# Patient Record
Sex: Male | Born: 1995 | Race: Black or African American | Hispanic: No | Marital: Single | Smoking: Never smoker
Health system: Southern US, Community
[De-identification: ages and names within clinical notes are randomized; demographics above are authoritative.]

## PROBLEM LIST (undated history)

## (undated) DIAGNOSIS — R109 Unspecified abdominal pain: Secondary | ICD-10-CM

## (undated) DIAGNOSIS — T7840XA Allergy, unspecified, initial encounter: Secondary | ICD-10-CM

## (undated) DIAGNOSIS — J45909 Unspecified asthma, uncomplicated: Secondary | ICD-10-CM

## (undated) DIAGNOSIS — K921 Melena: Secondary | ICD-10-CM

## (undated) HISTORY — PX: OTHER SURGICAL HISTORY: SHX169

## (undated) HISTORY — DX: Melena: K92.1

## (undated) HISTORY — PX: TONSILLECTOMY: SUR1361

## (undated) HISTORY — DX: Unspecified abdominal pain: R10.9

## (undated) HISTORY — PX: ADENOIDECTOMY: SUR15

---
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
## 2015-05-28 ENCOUNTER — Ambulatory Visit (INDEPENDENT_AMBULATORY_CARE_PROVIDER_SITE_OTHER): Payer: 59 | Admitting: Pediatrics

## 2015-05-28 ENCOUNTER — Encounter: Payer: Self-pay | Admitting: Pediatrics

## 2015-05-28 VITALS — BP 126/60 | HR 80 | Temp 98.4°F | Resp 20 | Ht 70.0 in | Wt 233.9 lb

## 2015-05-28 DIAGNOSIS — J4541 Moderate persistent asthma with (acute) exacerbation: Secondary | ICD-10-CM | POA: Diagnosis not present

## 2015-05-28 DIAGNOSIS — J301 Allergic rhinitis due to pollen: Secondary | ICD-10-CM

## 2015-05-28 DIAGNOSIS — J45901 Unspecified asthma with (acute) exacerbation: Secondary | ICD-10-CM | POA: Insufficient documentation

## 2015-05-28 MED ORDER — BECLOMETHASONE DIPROPIONATE 80 MCG/ACT IN AERS: 2.0000 | INHALATION_SPRAY | Freq: Two times a day (BID) | RESPIRATORY_TRACT | Status: AC | PRN

## 2015-05-28 MED ORDER — ALBUTEROL SULFATE HFA 108 (90 BASE) MCG/ACT IN AERS: 2.0000 | INHALATION_SPRAY | Freq: Four times a day (QID) | RESPIRATORY_TRACT | Status: AC | PRN

## 2015-05-28 MED ORDER — ALBUTEROL SULFATE (2.5 MG/3ML) 0.083% IN NEBU: 2.5000 mg | INHALATION_SOLUTION | RESPIRATORY_TRACT | Status: DC | PRN

## 2015-05-28 NOTE — Patient Instructions (Addendum)
Qvar 80- 2 puffs once a day to prevent coughing or wheezing Ventolin 2 puffs every 4 hours if needed for wheezing or coughing spells-Ventolin will replace Pro-air Albuterol 0.083% one unit dose every 4 hours if needed for coughing or wheezing Add prednisone 20 mg twice a day for 3 days, 20 mg on the fourth day, 10 mg on the fifth day Call me if you're not doing well on this treatment plan  Zyrtec 10 mg once a day if needed for runny nose Nasacort 2 sprays per nostril once a day if needed for stuffy nose  Continue avoiding shellfish. If you have an allergic reaction take Benadryl 50 mg every 4 hours and if you have life-threatening symptoms inject  with EpiPen 0.3 mg

## 2015-05-28 NOTE — Progress Notes (Signed)
[address] [place] [address] Dept: [phone]  FOLLOW UP NOTE  Patient ID: Benjamin Moses, male    DOB: 1996/03/28  Age: 20 y.o. MRN: [id number] Date of Office Visit: 05/28/2015  Assessment Chief Complaint: Cough  HPI Benjamin Moses presents for treatment of coughing and wheezing for about 4 days. His nasal congestion has been well controlled. His asthma was well controlled until the past 4 days. In 2005 he was allergic to grass pollens, pollens, tree pollens, and some molds. No airborne testing since then  Current medications Zyrtec 10 mg once a day if needed for runny nose, Nasacort 2 sprays per nostril once a day if needed for stuffy nose, Pro-air 2 puffs every 4 hours if needed, Qvar 80- 2 puffs once a day and albuterol 0.083% one unit dose every 4 hours if needed Drug Allergies:    Allergies  Allergen Reactions  . Food     Soy, citrus, corn   . Red Dye Nausea And Vomiting  . Shellfish Allergy Swelling    Abdominal cramps    Physical Exam: BP 126/60 mmHg  Pulse 80  Temp(Src) 98.4 F (36.9 C) (Oral)  Resp 20  Ht  (1.778 m)  Wt 233 lb 14.5 oz (106.1 kg)  BMI 33.56 kg/m2   Physical Exam  Constitutional: He is oriented to person, place, and time. He appears well-developed and well-nourished.  HENT:  Eyes normal. Ears normal. Nose mild swelling of his turbinates. Pharynx normal.  Neck: Neck supple.  Cardiovascular:  S1 and S2 normal no murmurs  Pulmonary/Chest:  Clear to percussion and auscultation except for some wheezing in both lungs  Lymphadenopathy:    He has no cervical adenopathy.  Neurological: He is alert and oriented to person, place, and time.  Psychiatric: He has a normal mood and affect. His behavior is normal. Thought content normal.  Vitals reviewed.   Diagnostics:  FVC 3.44 L FEV1 2.25 L. Predicted FVC 4.61 L predicted FEV1 3.95 L. After albuterol by nebulization FVC 4.36 L FEV1 3.15 L-his shows a mild reduction in the  forced vital capacity and a moderate reduction in the FEV1 with significant improvement after albuterol  Assessment and Plan: 1. Asthma with acute exacerbation, moderate persistent   2. Allergic rhinitis due to pollen     Meds ordered this encounter  Medications  . beclomethasone (QVAR) 80 MCG/ACT inhaler    Sig: Inhale 2 puffs into the lungs 2 (two) times daily as needed (shortness of breath/wheezing).    Dispense:  1 Inhaler    Refill:  5  . albuterol (PROVENTIL HFA;VENTOLIN HFA) 108 (90 Base) MCG/ACT inhaler    Sig: Inhale 2 puffs into the lungs every 6 (six) hours as needed for wheezing or shortness of breath.    Dispense:  1 Inhaler    Refill:  1  . albuterol (PROVENTIL) (2.5 MG/3ML) 0.083% nebulizer solution    Sig: Take 3 mLs (2.5 mg total) by nebulization every 4 (four) hours as needed for wheezing or shortness of breath.    Dispense:  75 mL    Refill:  1    Patient Instructions  Qvar 80- 2 puffs once a day to prevent coughing or wheezing Ventolin 2 puffs every 4 hours if needed for wheezing or coughing spells-Ventolin will replace Pro-air Albuterol 0.083% one unit dose every 4 hours if needed for coughing or wheezing Add prednisone 20 mg twice a day for 3 days, 20 mg on the fourth day, 10  mg on the fifth day Call me if you're not doing well on this treatment plan  Zyrtec 10 mg once a day if needed for runny nose Nasacort 2 sprays per nostril once a day if needed for stuffy nose  Continue avoiding shellfish. If you have an allergic reaction take Benadryl 50 mg every 4 hours and if you have life-threatening symptoms inject  with EpiPen 0.3 mg    Return in about 6 months (around 11/25/2015).    Thank you for the opportunity to care for this patient.  Please do not hesitate to contact me with questions.  Tonette Bihari, M.D.  [facility] [address] [place], [place] [address] [phone]

## 2015-06-26 ENCOUNTER — Telehealth: Payer: Self-pay | Admitting: Internal Medicine

## 2015-06-26 NOTE — Telephone Encounter (Signed)
She wants to make sure that his charges were filed on his insurance.

## 2015-06-27 NOTE — Telephone Encounter (Signed)
TOLD MOM THAT THIS WAS FILE BUT IT WENT TO THE DED - SHE WILL CALL INS - SHE DOESN'T THINK THEY HAVE A DED.

## 2015-11-25 ENCOUNTER — Ambulatory Visit: Payer: 59 | Admitting: Pediatrics

## 2016-01-27 IMAGING — CR DG ABDOMEN 1V
1 series · 1 of 1 positions shown · non-contrast
Comparison: CT ABD-PELV W/ CM dated 08/08/2013

CLINICAL DATA: Two month history of right upper quadrant abdominal
pain and nausea

EXAM:
ABDOMEN - 1 VIEW

[t abdomen supine]
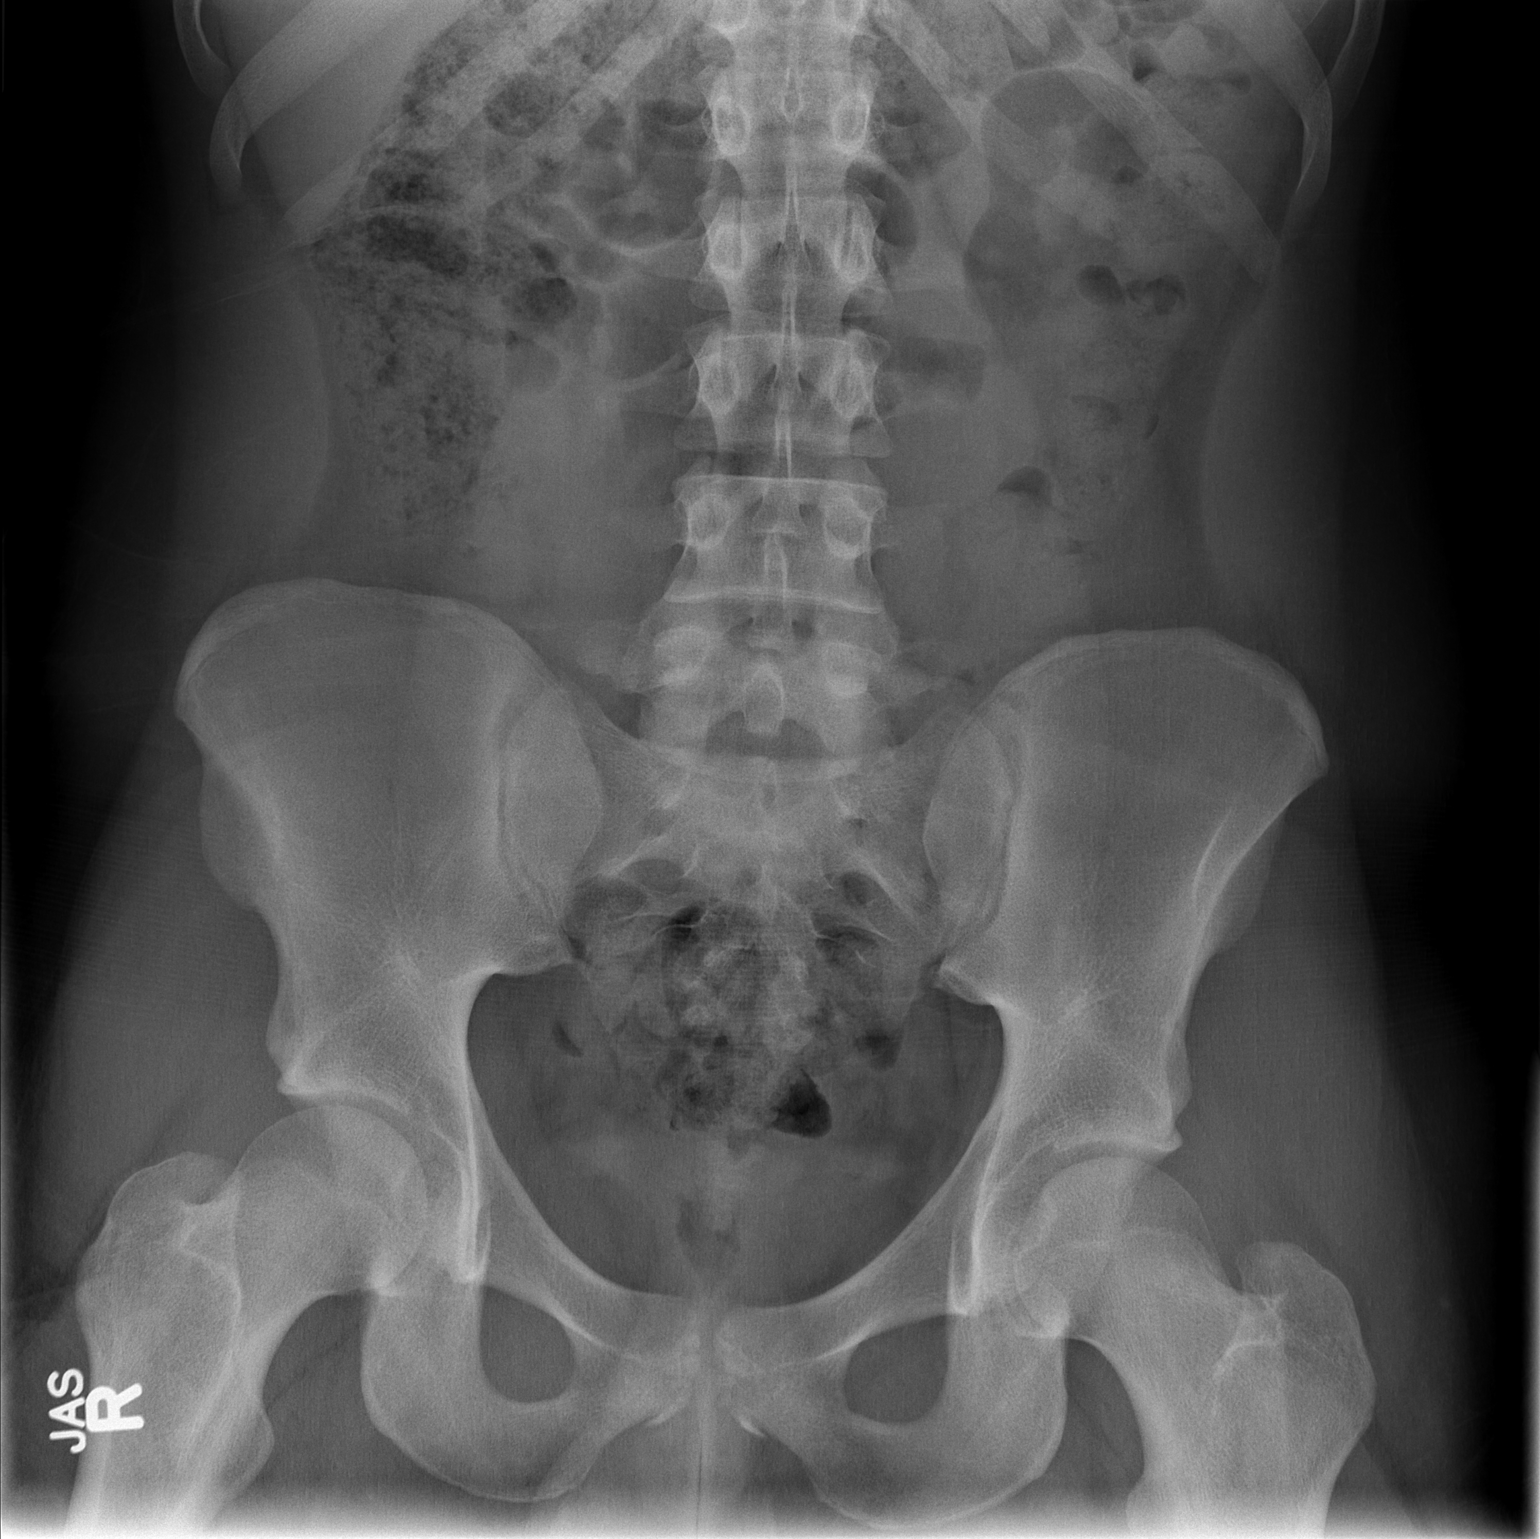

[1 of 1 positions shown; findings below may reference images not displayed]

FINDINGS: The upper abdomen is excluded from the film. The mid and lower
abdomen and pelvis demonstrate a moderate stool burden throughout
the colon. There is no evidence of a small or large bowel
obstruction. There are no abnormal soft tissue calcifications. The
observed bony structures appear normal.
IMPRESSION: There is a moderately increased stool burden throughout the colon
which may reflect clinical constipation. The upper abdomen is not
included in the field of view.

## 2016-02-03 ENCOUNTER — Ambulatory Visit (INDEPENDENT_AMBULATORY_CARE_PROVIDER_SITE_OTHER): Payer: 59 | Admitting: Pediatrics

## 2016-02-03 ENCOUNTER — Encounter: Payer: Self-pay | Admitting: Pediatrics

## 2016-02-03 VITALS — BP 124/72 | HR 67 | Temp 98.4°F | Resp 16 | Ht 70.0 in | Wt 225.0 lb

## 2016-02-03 DIAGNOSIS — J301 Allergic rhinitis due to pollen: Secondary | ICD-10-CM

## 2016-02-03 DIAGNOSIS — T7800XD Anaphylactic reaction due to unspecified food, subsequent encounter: Secondary | ICD-10-CM | POA: Diagnosis not present

## 2016-02-03 DIAGNOSIS — J4520 Mild intermittent asthma, uncomplicated: Secondary | ICD-10-CM | POA: Diagnosis not present

## 2016-02-03 DIAGNOSIS — T7800XA Anaphylactic reaction due to unspecified food, initial encounter: Secondary | ICD-10-CM | POA: Insufficient documentation

## 2016-02-03 MED ORDER — ALBUTEROL SULFATE HFA 108 (90 BASE) MCG/ACT IN AERS: 2.0000 | INHALATION_SPRAY | RESPIRATORY_TRACT | 3 refills | Status: AC | PRN

## 2016-02-03 MED ORDER — ALBUTEROL SULFATE (2.5 MG/3ML) 0.083% IN NEBU: 2.5000 mg | INHALATION_SOLUTION | RESPIRATORY_TRACT | 3 refills | Status: AC | PRN

## 2016-02-03 NOTE — Patient Instructions (Addendum)
Zyrtec 10 mg once a day for runny nose Nasacort 2 sprays per nostril once a day if needed for stuffy nose Pro-air 2 puffs every 4 hours if needed for wheezing or coughing spells Albuterol 0.083% one unit dose every 4 hours if needed for coughing or wheezing If you  develop a cold or have coughing and wheezing, you may need to restart Qvar 80- 2 puffs once a day and use it for about 3 months to prevent coughing or wheezing You  should have a flu vaccination Call me if you are not doing well on this treatment plan

## 2016-02-03 NOTE — Progress Notes (Signed)
[address] [place] [address] Dept: [phone]  FOLLOW UP NOTE  Patient ID: Benjamin Moses, male    DOB: [birth year]  Age: 20 y.o. MRN: [id number] Date of Office Visit: 02/03/2016  Assessment  Chief Complaint: Asthma (doing well)  HPI Benjamin Moses presents for follow-up of asthma, allergic rhinitis and food allergies. His asthma is well controlled and he very rarely needs to use Pro-air. He was able to stop Qvar 80 several months ago. He continues to avoid shellfish. His nasal symptoms are under control  Current medications are Zyrtec 10 mg once a day, Nasacort 2 sprays per nostril once a day if needed, Pro-air 2 puffs every 4 hours if needed and albuterol 0.083% one unit dose every 4 hours if needed   Drug Allergies:  Allergies  Allergen Reactions  . Food     Soy, citrus, corn   . Red Dye Nausea And Vomiting  . Shellfish Allergy Swelling    Abdominal cramps    Physical Exam: BP 124/72   Pulse 67   Temp 98.4 F (36.9 C) (Oral)   Resp 16   Ht 5\' 10"  (1.778 m)   Wt 225 lb (102.1 kg)   SpO2 96%   BMI 32.28 kg/m    Physical Exam  Constitutional: He is oriented to person, place, and time. He appears well-developed and well-nourished.  HENT:  Eyes normal. Ears normal. Nose normal. Pharynx normal.  Neck: Neck supple.  Cardiovascular:  S1 and S2 normal no murmurs  Pulmonary/Chest:  Clear to percussion and auscultation  Abdominal: Soft. There is no tenderness (no hepatosplenomegaly).  Lymphadenopathy:    He has no cervical adenopathy.  Neurological: He is alert and oriented to person, place, and time.  Skin:  Clear  Psychiatric: He has a normal mood and affect. His behavior is normal. Judgment and thought content normal.  Vitals reviewed.   Diagnostics:  FVC 4.74 L FEV1 is 2.95 L. Predicted FVC 4.61 L predicted FEV1 3.95 L-this shows a mild reduction in the FEV1 but much improved from last year.  Assessment and Plan: 1. Mild intermittent asthma  without complication   2. Acute seasonal allergic rhinitis due to pollen   3. Anaphylactic shock due to food, subsequent encounter     Meds ordered this encounter  Medications  . albuterol (PROAIR HFA) 108 (90 Base) MCG/ACT inhaler    Sig: Inhale 2 puffs into the lungs every 4 (four) hours as needed for wheezing or shortness of breath.    Dispense:  1 Inhaler    Refill:  3  . albuterol (PROVENTIL) (2.5 MG/3ML) 0.083% nebulizer solution    Sig: Take 3 mLs (2.5 mg total) by nebulization every 4 (four) hours as needed for wheezing or shortness of breath.    Dispense:  75 mL    Refill:  3    Patient Instructions  Zyrtec 10 mg once a day for runny nose Nasacort 2 sprays per nostril once a day if needed for stuffy nose Pro-air 2 puffs every 4 hours if needed for wheezing or coughing spells Albuterol 0.083% one unit dose every 4 hours if needed for coughing or wheezing If you  develop a cold or have coughing and wheezing, you may need to restart Qvar 80- 2 puffs once a day and use it for about 3 months to prevent coughing or wheezing You  should have a flu vaccination Call me if you are not doing well on this treatment plan   Return in  about 1 year (around 02/02/2017).    Thank you for the opportunity to care for this patient.  Please do not hesitate to contact me with questions.  Tonette BihariJ. A. Bardelas, M.D.  [facility] [address] [place], [place] [address] [phone]

## 2017-02-02 ENCOUNTER — Ambulatory Visit: Payer: 59 | Admitting: Pediatrics
